# Patient Record
Sex: Female | Born: 1995 | Race: White | Hispanic: No | Marital: Single | Smoking: Never smoker
Health system: Southern US, Community
[De-identification: ages and names within clinical notes are randomized; demographics above are authoritative.]

## PROBLEM LIST (undated history)

## (undated) DIAGNOSIS — F4010 Social phobia, unspecified: Secondary | ICD-10-CM

## (undated) HISTORY — PX: OTHER SURGICAL HISTORY: SHX169

---
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
## 2014-07-13 LAB — CBC
HCT: 44.8 % (ref 35.0–47.0)
HGB: 14.7 g/dL (ref 12.0–16.0)
MCH: 32.1 pg (ref 26.0–34.0)
MCHC: 32.8 g/dL (ref 32.0–36.0)
MCV: 98 fL (ref 80–100)
Platelet: 213 10*3/uL (ref 150–440)
RBC: 4.58 10*6/uL (ref 3.80–5.20)
RDW: 12.6 % (ref 11.5–14.5)
WBC: 7.9 10*3/uL (ref 3.6–11.0)

## 2014-07-13 LAB — URINALYSIS, COMPLETE
Bilirubin,UR: NEGATIVE
Blood: NEGATIVE
GLUCOSE, UR: NEGATIVE mg/dL (ref 0–75)
LEUKOCYTE ESTERASE: NEGATIVE
NITRITE: NEGATIVE
Ph: 5 (ref 4.5–8.0)
Protein: NEGATIVE
RBC,UR: 1 /HPF (ref 0–5)
Specific Gravity: 1.01 (ref 1.003–1.030)
Squamous Epithelial: <1
WBC UR: <1 /HPF (ref 0–5)

## 2014-07-13 LAB — BASIC METABOLIC PANEL
Anion Gap: 13 (ref 7–16)
BUN: 8 mg/dL (ref 7–18)
CHLORIDE: 109 mmol/L — AB (ref 98–107)
CREATININE: 0.67 mg/dL (ref 0.60–1.30)
Calcium, Total: 8.5 mg/dL — ABNORMAL LOW (ref 9.0–10.7)
Co2: 18 mmol/L — ABNORMAL LOW (ref 21–32)
EGFR (African American): >60
EGFR (Non-African Amer.): >60
GLUCOSE: 60 mg/dL — AB (ref 65–99)
OSMOLALITY: 276 (ref 275–301)
Potassium: 3.9 mmol/L (ref 3.5–5.1)
Sodium: 140 mmol/L (ref 136–145)

## 2014-07-13 LAB — COMPREHENSIVE METABOLIC PANEL
ALK PHOS: 83 U/L
Albumin: 4.7 g/dL (ref 3.8–5.6)
Anion Gap: 17 — ABNORMAL HIGH (ref 7–16)
BUN: 9 mg/dL (ref 7–18)
Bilirubin,Total: 0.8 mg/dL (ref 0.2–1.0)
CREATININE: 0.54 mg/dL — AB (ref 0.60–1.30)
Calcium, Total: 9.2 mg/dL (ref 9.0–10.7)
Chloride: 105 mmol/L (ref 98–107)
Co2: 15 mmol/L — ABNORMAL LOW (ref 21–32)
EGFR (Non-African Amer.): >60
GLUCOSE: 71 mg/dL (ref 65–99)
Osmolality: 271 (ref 275–301)
Potassium: 5.1 mmol/L (ref 3.5–5.1)
SGOT(AST): 35 U/L — ABNORMAL HIGH (ref 0–26)
SGPT (ALT): 28 U/L
Sodium: 137 mmol/L (ref 136–145)
TOTAL PROTEIN: 8.4 g/dL (ref 6.4–8.6)

## 2014-07-13 LAB — DRUG SCREEN, URINE
Amphetamines, Ur Screen: NEGATIVE (ref ?–1000)
BARBITURATES, UR SCREEN: NEGATIVE (ref ?–200)
Benzodiazepine, Ur Scrn: NEGATIVE (ref ?–200)
COCAINE METABOLITE, UR ~~LOC~~: NEGATIVE (ref ?–300)
Cannabinoid 50 Ng, Ur ~~LOC~~: NEGATIVE (ref ?–50)
MDMA (ECSTASY) UR SCREEN: NEGATIVE (ref ?–500)
METHADONE, UR SCREEN: NEGATIVE (ref ?–300)
Opiate, Ur Screen: NEGATIVE (ref ?–300)
Phencyclidine (PCP) Ur S: NEGATIVE (ref ?–25)
Tricyclic, Ur Screen: NEGATIVE (ref ?–1000)

## 2014-07-13 LAB — ACETAMINOPHEN LEVEL: Acetaminophen: <2 ug/mL

## 2014-07-13 LAB — TSH: Thyroid Stimulating Horm: 0.97 u[IU]/mL

## 2014-07-13 LAB — ETHANOL: Ethanol: <3 mg/dL

## 2014-07-13 LAB — SALICYLATE LEVEL: Salicylates, Serum: <1.7 mg/dL

## 2014-07-14 ENCOUNTER — Inpatient Hospital Stay: Payer: Self-pay | Admitting: Psychiatry

## 2014-07-18 LAB — LIPID PANEL
Cholesterol: 121 mg/dL (ref 0–200)
HDL: 51 mg/dL (ref 40–60)
Ldl Cholesterol, Calc: 57 mg/dL (ref 0–100)
Triglycerides: 64 mg/dL (ref 0–135)
VLDL Cholesterol, Calc: 13 mg/dL (ref 5–40)

## 2014-07-18 LAB — HEMOGLOBIN A1C: HEMOGLOBIN A1C: 5.4 % (ref 4.2–6.3)

## 2014-07-23 LAB — HEPATIC FUNCTION PANEL A (ARMC)
ALBUMIN: 3.9 g/dL (ref 3.8–5.6)
ALK PHOS: 75 U/L
Bilirubin, Direct: <0.1 mg/dL (ref 0.0–0.2)
Bilirubin,Total: 0.3 mg/dL (ref 0.2–1.0)
SGOT(AST): 17 U/L (ref 0–26)
SGPT (ALT): 21 U/L
TOTAL PROTEIN: 7.1 g/dL (ref 6.4–8.6)

## 2014-07-23 LAB — CARBAMAZEPINE LEVEL, TOTAL
Carbamazepine: 13.6 ug/mL (ref 4.0–12.0)
Carbamazepine: 13.5 ug/mL (ref 4.0–12.0)

## 2014-07-27 LAB — CARBAMAZEPINE LEVEL, TOTAL: Carbamazepine: 3.1 ug/mL — ABNORMAL LOW (ref 4.0–12.0)

## 2014-12-04 NOTE — Consult Note (Signed)
   Psychological Assessment  [name] Pandarica Kimrey18of Evaluation: 07-22-14 through 12-12-15Administered: [facility] Multiphasic Personality Inventory-2 (MMPI-2) for Referral: Ms. [name] [name] was referred for a psychological assessment by her physician, Kristine LineaJolanta Pucilowska, MD.  She was admitted to Behavioral Medicine for treatment of disorganized behavior with depression and anxiety as well as insomnia. Please see the history and physical and psychosocial history for further background information. Information which would help with diagnostic clarification was requested. M MMPI-2 protocol is compared to that of other adult females she obtained the following profile: [id number]. The MMPI-2 validity scales indicate that the clinical profile is valid. They also suggest she is experiencing distress which she is attempting to minimize.  Clinical Presentation Moods: She reports that she is experiencing only a mild level of chronic. She is happy most of the time even though she reports a mild level of dysphoric mood and anhedonia. Her daily life is full of things that keep her interested. It makes her angry when people hurry. She reports that her attention, concentration seem alright but that she does have some problems with memory. She is apt to pass up something she wants to do when others think it is not worth doing. She believes that her judgment is better than it ever has been. She thinks that most people will use unfair means and stretch the truth to get ahead. She has often been misunderstood when she was trying to be helpful. She knows who is responsible for most of her troubles.  Relations: She reports that she is very introverted. She is very shy and is easily embarrassed. She is uncomfortable in social situations and likely not to speak to people until they speak to her. At parties she is more likely to sit by herself or with one other person than to join in with the crowd. Whenever possible she avoids being in  a crowd. She finds it hard to talk when she meets new people or is in a group of people. She does not seem to make friends as quickly as others do. She sometimes finds it hard to stand up for her rights because she is so reserved and she is easily downed in an argument. She is passive and prone to give up easily.  Problem Areas: She reports that she is in good physical health. However, she does not sleep very well. She usually has enough energy to do her work) and is about as able to work as she ever was. She is not likely to abuse substances. Her introversion and isolation help to keep her from getting into behavioral problems.  Her prognosis is guarded because of her minimal level of and chronic problems. Assertiveness or social-skill training may be beneficial if she can be engaged in the therapeutic process. There are a number of specific issues that must be kept in mind when establishing and maintaining the therapeutic alliance: she is so touchy on some subjects that she cannot talk about them, she is passive and nonassertive, it makes her nervous when people ask her personal questions, she hates going to doctors even when she is sick, and it is hard for her to accept compliments. Her difficulty in trusting and opening up to others makes the establishment of a therapeutic relationship a slow process. Impression:Anxiety DisorderPersistent Depressive Disorder   Electronic Signatures: Carola FrostRoush, Lee Ann (PsyD, HSP-P)  (Signed on 14-Dec-15 10:51)  Authored  Last Updated: 14-Dec-15 10:51 by Carola Frostoush, Lee Ann (PsyD, HSP-P)

## 2014-12-04 NOTE — H&P (Signed)
PATIENT NAME:  [name] [name], [name] [name] MR#:  [id number] DATE OF BIRTH:  July 17, 1996  REFERRING PHYSICIAN: Emergency Room MD   ATTENDING PHYSICIAN: Kairos Panetta B. Jennet Maduro, MD   IDENTIFYING DATA: Ms. [name] [name] is a 19 year old female with history of depression.   CHIEF COMPLAINT: "I don't feel well. "   HISTORY OF PRESENT ILLNESS: [name] has a history of depression for which she was prescribed antidepressants over the years by Dr. Elesa Massed. She has never been compliant and is unable to list the name of medications she has tried, but apparently there were several antidepressants involved. She initially sought psychiatric help for social anxiety that can be very severe. She reports that until end of June when she graduated from high school, she was doing alright. Over the summer, however, she started experiencing strange episodes of racing thoughts, hyperactivity, paranoia, and possibly hallucinations, although the patient does not want to discuss her voices. She also was receiving some messages. She says that up until now she would not have an episode that lasted a day or a couple of days at most. However, over the Thanksgiving holiday she has been increasingly paranoid, fearful, and disorganized. During initial interview in the Emergency Room, and today with me, she is unable to answer many simple questions. For example, I know that her psychiatrist is Dr. Elesa Massed from the chart, but when I asked her about it she was unable to name him and then was trying to ask me if this is the doctor who has his office in the back. She seems rather disorganized and may be slightly confused. Apparently, she came to the hospital after she called 911. She was trying to make  police aware that her friend was having suicidal thoughts. The police went to his house, supposedly he is alright, but somehow the patient herself ended up in the hospital as well. She disclosed psychotic symptoms to admitting psychiatrist as well. She denies mood symptoms.  She endorses severe social anxiety. She denies alcohol, illicit drugs, or prescription pill abuse.   PAST PSYCHIATRIC HISTORY:  Under the care of Dr. Elesa Massed for social anxiety and possibly depression. No suicide attempts, no substance abuse history, no hospitalizations.   FAMILY PSYCHIATRIC HISTORY: Mother with depression, possibly bipolar; sister with depression and diabetes; father with anger control issues.   PAST MEDICAL HISTORY: None.   ALLERGIES: No known drug allergies.   MEDICATIONS ON ADMISSION: None.   SOCIAL HISTORY:  Graduated from high school in June, does not have a job. Lives with her parents.  Her sister reports being bullied in school. She had a boyfriend in high school. Now reportedly, she has a new boyfriend. I am not sure if she drives.   REVIEW OF SYSTEMS:  CONSTITUTIONAL: No fevers or chills. No weight changes.  EYES: No double or blurred vision.  EARS, NOSE, THROAT: No hearing loss.  RESPIRATORY: No shortness of breath or cough.  CARDIOVASCULAR: No chest pain or orthopnea.  GASTROINTESTINAL: No abdominal pain, nausea, vomiting, or diarrhea.  GENITOURINARY: No incontinence or frequency.  ENDOCRINE: No heat or cold intolerance.  LYMPHATIC: No anemia or easy bruising.  INTEGUMENTARY: No acne or rash.  MUSCULOSKELETAL: No muscle or joint pain.  NEUROLOGIC: No tingling or weakness.  PSYCHIATRIC: See history of present illness for details.   PHYSICAL EXAMINATION: VITAL SIGNS: Blood pressure 119/83, pulse 105, respirations 18, temperature 98.5.  GENERAL: This is a slender young female, anxious-appearing.  HEENT: The pupils are equal, round, and reactive to light. Sclerae anicteric.  NECK:  Supple. No thyromegaly.  LUNGS: Clear to auscultation. No dullness to percussion.  HEART: Regular rhythm and rate. No murmurs, rubs, or gallops.  ABDOMEN: Soft, nontender, nondistended. Positive bowel sounds.  MUSCULOSKELETAL: Normal muscle strength in all extremities.  SKIN: No  rashes or bruises.  LYMPHATIC: No cervical adenopathy.  NEUROLOGIC: Cranial nerves II through XII are intact.   LABORATORY DATA: Chemistries are within normal limits. Blood alcohol level is zero. LFTs within normal limits except for AST of 35. TSH 0.97. Urine toxicology screen negative for substances. CBC within normal limits. Urinalysis is not suggestive of urinary tract infection. Serum acetaminophen and salicylates are low.   MENTAL STATUS EXAMINATION ON ADMISSION: The patient is alert and oriented to person, place, time, and situation. She is pleasant, polite, and cooperative. She maintains adequate eye contact. She is well groomed and casually dressed. Her speech is very soft. There is poverty of speech. Her mood is anxious with reactive affect. Thought process is logical and goal oriented with its own logic. She denies thoughts of hurting herself or others. She is paranoid and possibly delusional. She checks my name tag several times before we start talking. She is fearful frequently, but is okay in her room now. She is uncertain if she will be able to eat in the dining area. She denies hallucinations reluctantly. It is quite possible that she experiences some. Her cognition is grossly intact. Registration, recall, short- and long-term memory are intact. She is of average intelligence and fund of knowledge. Her insight and judgment seem fair.   SUICIDE RISK ASSESSMENT ON ADMISSION: This is a patient with history of depression and anxiety and new onset psychotic symptoms. She is at increased risk of suicide.   INITIAL DIAGNOSES:  AXIS I: Unspecified psychotic disorder, social anxiety disorder, major depressive disorder, moderate.  AXIS II: Deferred.  AXIS III: Deferred.   PLAN: The patient was admitted to [facility]al Medicine Unit for safety, stabilization, and medication management.  1.  Psychosis. We will start Abilify 5 mg, titrate as tolerated.  2.  Anxiety.  We will offer Vistaril for now.  3.  Insomnia. We will continue trazodone as initiated in the Emergency Room.   DISPOSITION: She will be discharged to home.   ____________________________ Ellin GoodieJolanta B. Jennet MaduroPucilowska, MD jbp:LT D: 07/14/2014 16:02:20 ET T: 07/14/2014 16:31:48 ET JOB#: [id number]  cc: Estrellita Lasky B. Jennet MaduroPucilowska, MD, <Dictator> Shari ProwsJOLANTA B Sneijder Bernards MD ELECTRONICALLY SIGNED 07/15/2014 17:27

## 2014-12-04 NOTE — Consult Note (Signed)
PATIENT NAME:  [name] [name] MR#:  [id number] DATE OF BIRTH:  10/13/1995  DATE OF CONSULTATION:  07/14/2014  IDENTIFYING INFORMATION AND REASON FOR CONSULT: A 19-year-old woman who was brought in under involuntary commitment yesterday to the Emergency Room filed by [facility]. Concerned about recent psychosis.   HISTORY OF PRESENT ILLNESS: Information obtained from the patient and the chart.   CHIEF COMPLAINT: "I had a nervous breakdown." She states that yesterday, her day started with her calling 911 on behalf of a friend of hers who she thought was suicidal. I do not have any outside information about that, it sounds like maybe somebody had reason to think that there was something strange about this. The patient reports that her mood recently has not been right. She has not been sleeping well for several days. Sometimes does not sleep at all. Mood has been overall up, but it has also been labile. She has been having racing thoughts and been feeling confused. She talks about getting messages and has a hard time describing it very clearly. When she came into the Emergency Room yesterday, she was extremely agitated and very psychotic. She was unable to give any useful history and was repeating herself over and over and calling people by incorrect names. She had gone to [facility] yesterday, evidently on her own initiative because she was not feeling right. While there, it looks like she probably displayed some psychosis and agitation, which caused them to bring her to our hospital. The patient does not report that she had actually tried to hurt herself. She denies that she has been drinking or using any recreational drugs. She has been prescribed an antidepressant, whose name she does not know by Dr. Moffett at [facility]. She has been intermittently compliant with it. She thinks that it makes her feel crazy and overstimulated.   PAST PSYCHIATRIC HISTORY: No known prior hospitalizations. No known history of suicide attempts. She  has been seeing providers at [facility] for perhaps as much as a year. She goes to, what sounds like at a group, about social anxiety and has seen Dr. Moffett who has prescribed a medicine that she says was an antidepressant. She says that she has been taking it for a couple of months and that it makes her feel like "the incredible hulk." She has not been back to see Dr. Moffett to tell him that it is causing side effects. She does not take it regularly, but has continued to take it at times. Does not report any other psychiatric medicine in the past.   SUBSTANCE ABUSE HISTORY: Denies that she uses alcohol or drugs. Denies any past substance abuse problems.   SOCIAL HISTORY: The patient lives with her parents and her sister. She graduated high school, but currently is not attending college and is not working right now. She talks about an ex-boyfriend, named Shawn. She talks about a person named Dakota on whose behalf she was calling 911 yesterday. She also talks about there being a person who is her current boyfriend and whom she is in communication with, but she has never actually met this person face to face. She says that she is planning to go to college, but has no clear plans out into the future. She is not able to tell me any history of any major trauma that would support PTSD.   SUBSTANCE ABUSE HISTORY: No evidence of current alcohol or drug abuse. Denies any past alcohol or drug abuse.   FAMILY HISTORY: Reports that both her   mother and sister have problems with depression.   CURRENT MEDICATIONS: Antidepressant of unknown type. She cannot remember the name.   ALLERGIES: No known drug allergies.  REVIEW OF SYSTEMS: The patient currently seems to be feeling a little bit better. She is not reporting acute hallucinations. Not reporting acute suicidal or homicidal ideation. Does not appear in any acute distress right now. Still feels nervous and worried.   MENTAL STATUS EXAMINATION: Adequately groomed  under the circumstances. Woman looks her stated age, cooperative with the interview and appears to be forthcoming. Eye contact good. Psychomotor activity normal. Speech is a little bit slow and quiet, but not to a really remarkable degree. Affect is somewhat constricted and odd at times. Mood is stated as being mostly happy. Her thoughts still are somewhat disorganized and rambling. She gets off topic and provides excessive detail at times. She denies that she is having hallucinations, but says that some things that may these sort of like that happen at times. Sometimes she feels like she is getting messages. Denies any current suicidal or homicidal ideation. Can recall 3/3 objects immediately, 3/3 at minute at 3 minutes. She is alert and oriented x4. Judgment and insight improved, certainly compared to yesterday. Normal fund of knowledge appears to be of normal intelligence.   LABORATORY RESULTS: Pregnancy test is negative. Chemistry panel showed glucose, low at 60, low CO2 and calcium, but nothing really remarkable. Drug screen is all negative. TSH is normal. The CBC is normal. She was slightly acidotic on blood gas that was done last night, but there is no follow up on that. Urinalysis has ketones, but is not infected.   VITAL SIGNS: In the Emergency Room, her blood pressure is currently 119/83, respirations 18, pulse 105, temperature 98.5.   ASSESSMENT: This is a 19 year old woman who presented extremely psychotic, hyperactive, agitated, confused yesterday. She gives a history of anxiety symptoms, but says that more recently, she has been having, what she calls nervous breakdowns. She has been prescribed an antidepressant and says that it makes her feel like the incredible hulk. For the last few days, she is not sleeping well, seems to be getting confused and agitated and yesterday was clearly psychotic. Differential diagnosis would include bipolar disorder, schizophreniform disorder or schizophrenia and  possibly substance induced psychosis. No evidence of drug abuse. No specific evidence of any medical problems. The patient needs hospitalization for stabilization and diagnostic clarity and treatment planning.   TREATMENT PLAN: Admit to psychiatry. Ativan p.r.n. has been provided. Appropriate precautions in place. Treatment team downstairs can decide on appropriate psychiatric medication otherwise. The patient has been educated about the process and rationale for treatment.   DIAGNOSIS, PRINCIPAL AND PRIMARY:  AXIS I: Psychosis not otherwise specified.   SECONDARY DIAGNOSES: AXIS I: Rule out bipolar disorder, manic, rule out schizophreniform disorder.  AXIS II: Deferred. AXIS III: No diagnosis.   ____________________________ Gonzella Lex, MD jtc:DT D: 07/14/2014 11:57:39 ET T: 07/14/2014 12:50:07 ET JOB#: [id number]  cc: Gonzella Lex, MD, <Dictator> Gonzella Lex MD ELECTRONICALLY SIGNED 07/25/2014 11:19

## 2017-07-02 ENCOUNTER — Emergency Department
Admission: EM | Admit: 2017-07-02 | Discharge: 2017-07-02 | Disposition: A | Discharge status: Home / Self Care | Payer: Medicaid Other | Attending: Emergency Medicine | Admitting: Emergency Medicine

## 2017-07-02 DIAGNOSIS — J029 Acute pharyngitis, unspecified: Secondary | ICD-10-CM | POA: Insufficient documentation

## 2017-07-02 HISTORY — DX: Social phobia, unspecified: F40.10

## 2017-07-02 LAB — POCT RAPID STREP A: STREPTOCOCCUS, GROUP A SCREEN (DIRECT): NEGATIVE

## 2017-07-02 MED ORDER — AMOXICILLIN-POT CLAVULANATE 875-125 MG PO TABS
1.0000 | ORAL_TABLET | Freq: Once | ORAL | Status: AC
  Administered 2017-07-02: 1 via ORAL
  Filled 2017-07-02: qty 1

## 2017-07-02 MED ORDER — AMOXICILLIN-POT CLAVULANATE 875-125 MG PO TABS: 1.0000 | ORAL_TABLET | Freq: Two times a day (BID) | ORAL | 0 refills | Status: AC

## 2017-07-02 MED ORDER — IBUPROFEN 800 MG PO TABS
800.0000 mg | ORAL_TABLET | Freq: Once | ORAL | Status: AC
  Administered 2017-07-02: 800 mg via ORAL
  Filled 2017-07-02: qty 1

## 2017-07-02 NOTE — ED Provider Notes (Signed)
[facility] Emergency Department Provider Note    First MD Initiated Contact with Patient 07/02/17 [phone]     (approximate)  I have reviewed the triage vital signs and the nursing notes.   HISTORY  Chief Complaint Sore Throat    HPI [name] [name] is a 21 y.o. female presents with 3-day history of sore throat with increasing severity which began yesterday.  Patient denies any fever afebrile on presentation with a temperature of 98.3.   Past Medical History:  Diagnosis Date  . Social anxiety disorder     There are no active problems to display for this patient.   Past surgical history None  Prior to Admission medications   Not on File    Allergies No known drug allergies No family history on file.  Social History Social History   Tobacco Use  . Smoking status: Never Smoker  . Smokeless tobacco: Never Used  Substance Use Topics  . Alcohol use: No    Frequency: Never  . Drug use: No    Review of Systems Constitutional: No fever/chills Eyes: No visual changes. ENT: Positive for sore throat. Cardiovascular: Denies chest pain. Respiratory: Denies shortness of breath. Gastrointestinal: No abdominal pain.  No nausea, no vomiting.  No diarrhea.  No constipation. Genitourinary: Negative for dysuria. Musculoskeletal: Negative for neck pain.  Negative for back pain. Integumentary: Negative for rash. Neurological: Negative for headaches, focal weakness or numbness.  ____________________________________________   PHYSICAL EXAM:  VITAL SIGNS: ED Triage Vitals  Enc Vitals Group     BP 07/02/17 0534 108/83     Pulse Rate 07/02/17 0534 (!) 123     Resp 07/02/17 0534 17     Temp 07/02/17 0534 98.3 F (36.8 C)     Temp Source 07/02/17 0534 Oral     SpO2 07/02/17 0534 96 %     Weight 07/02/17 0535 76.2 kg (168 lb)     Height --      Head Circumference --      Peak Flow --      Pain Score 07/02/17 0535 8     Pain Loc --      Pain  Edu? --      Excl. in GC? --     Constitutional: Alert and oriented. Well appearing and in no acute distress. Eyes: Conjunctivae are normal.  Head: Atraumatic. Mouth/Throat: Mucous membranes are moist.  Pharyngeal erythema with exudate Neck: No stridor.  Positive submandibular lymphadenopathy Cardiovascular: Normal rate, regular rhythm. Good peripheral circulation. Grossly normal heart sounds. Respiratory: Normal respiratory effort.  No retractions. Lungs CTAB. Gastrointestinal: Soft and nontender. No distention.  Musculoskeletal: No lower extremity tenderness nor edema. No gross deformities of extremities. Neurologic:  Normal speech and language. No gross focal neurologic deficits are appreciated.  Skin:  Skin is warm, dry and intact. No rash noted. Psychiatric: Mood and affect are normal. Speech and behavior are normal.  ____________________________________________   LABS (all labs ordered are listed, but only abnormal results are displayed)  Labs Reviewed  POCT RAPID STREP A     Procedures   ____________________________________________   INITIAL IMPRESSION / ASSESSMENT AND PLAN / ED COURSE  As part of my medical decision making, I reviewed the following data within the electronic MEDICAL RECORD 89 year old female present with above-stated history and physical exam consistent with acute pharyngitis.  Patient given Augmentin in the emergency department will be prescribed the same for home.  Spoke with the patient and mother at length regarding warning  signs and. ____________________________________________  FINAL CLINICAL IMPRESSION(S) / ED DIAGNOSES  Final diagnoses:  Acute pharyngitis, unspecified etiology     MEDICATIONS GIVEN DURING THIS VISIT:  Medications  amoxicillin-clavulanate (AUGMENTIN) 875-125 MG per tablet 1 tablet (1 tablet Oral Given 07/02/17 0612)  ibuprofen (ADVIL,MOTRIN) tablet 800 mg (800 mg Oral Given 07/02/17 [id number])     ED Discharge Orders      None       Note:  This document was prepared using Dragon voice recognition software and may include unintentional dictation errors.    Darci CurrentBrown,  N, MD 07/02/17 [phone]

## 2017-07-02 NOTE — ED Notes (Signed)
Pt states she has had a sore throat for 3-4 days. Pt states that her nephew has also been sick. Family at bedside.

## 2017-07-02 NOTE — ED Triage Notes (Signed)
Patient c/o sore throat X 3 days with increasing severity yesterday.

## 2018-02-13 ENCOUNTER — Emergency Department
Admission: EM | Admit: 2018-02-13 | Discharge: 2018-02-13 | Disposition: A | Discharge status: Home / Self Care | Payer: Medicaid Other | Attending: Emergency Medicine | Admitting: Emergency Medicine

## 2018-02-13 ENCOUNTER — Other Ambulatory Visit: Payer: Self-pay

## 2018-02-13 DIAGNOSIS — H66001 Acute suppurative otitis media without spontaneous rupture of ear drum, right ear: Secondary | ICD-10-CM

## 2018-02-13 DIAGNOSIS — H9201 Otalgia, right ear: Secondary | ICD-10-CM | POA: Diagnosis present

## 2018-02-13 MED ORDER — AMOXICILLIN 500 MG PO TABS: 500.0000 mg | ORAL_TABLET | Freq: Three times a day (TID) | ORAL | 0 refills | Status: DC

## 2018-02-13 MED ORDER — AMOXICILLIN 500 MG PO CAPS
500.0000 mg | ORAL_CAPSULE | Freq: Once | ORAL | Status: AC
  Administered 2018-02-13: 500 mg via ORAL
  Filled 2018-02-13: qty 1

## 2018-02-13 MED ORDER — IBUPROFEN 400 MG PO TABS
400.0000 mg | ORAL_TABLET | Freq: Once | ORAL | Status: AC
  Administered 2018-02-13: 400 mg via ORAL
  Filled 2018-02-13: qty 1

## 2018-02-13 NOTE — Discharge Instructions (Signed)
Rotate tylenol and ibuprofen if needed for pain or fever.

## 2018-02-13 NOTE — ED Triage Notes (Signed)
Pt c/o right ear pain x3-4 days - denies any other symptoms

## 2018-02-13 NOTE — ED Provider Notes (Signed)
[facility] Emergency Department Provider Note ____________________________________________  Time seen: Approximately 5:37 PM  I have reviewed the triage vital signs and the nursing notes.   HISTORY  Chief Complaint Otalgia    HPI [name] [name] is a 22 y.o. female who presents to the emergency department for treatment and evaluation of right earache.   Symptoms started 3 to 4 days ago.  No known fever.  She denies recent URI or other illness.  No relief with over-the-counter eardrops.  Past Medical History:  Diagnosis Date  . Social anxiety disorder     There are no active problems to display for this patient.   History reviewed. No pertinent surgical history.  Prior to Admission medications   Medication Sig Start Date End Date Taking? Authorizing Provider  amoxicillin (AMOXIL) 500 MG tablet Take 1 tablet (500 mg total) by mouth 3 (three) times daily. 02/13/18   Chinita Pesterriplett, Alyha Marines B, FNP    Allergies Patient has no known allergies.  No family history on file.  Social History Social History   Tobacco Use  . Smoking status: Never Smoker  . Smokeless tobacco: Never Used  Substance Use Topics  . Alcohol use: No    Frequency: Never  . Drug use: No    Review of Systems Constitutional: Negative for fever.  Negative for decreased ability to hear from right or left ear(s). Eyes: Negative for discharge or drainage. ENT:       Positive for otalgia in right ear(s).      Negative for rhinorrhea or congestion.      Negative for sore throat. Gastrointestinal: Negative for nausea, vomiting, or diarrhea. Musculoskeletal: Negative for myalgias. Skin: Negative for rash, lesions, or wounds. Neurological: Negative for paresthesias. ____________________________________________   PHYSICAL EXAM:  VITAL SIGNS: ED Triage Vitals [02/13/18 1657]  Enc Vitals Group     BP (!) 135/95     Pulse Rate 97     Resp 14     Temp 98 F (36.7 C)     Temp Source Oral      SpO2 99 %     Weight 178 lb (80.7 kg)     Height 5\' 4"  (1.626 m)     Head Circumference      Peak Flow      Pain Score 7     Pain Loc      Pain Edu?      Excl. in GC?     Constitutional: Well appearing. Eyes: Conjunctivae are clear without discharge or drainage. Ears:       Right TM: Erythematous, bulging.      Left TM: Normal. Head: Atraumatic. Nose: No rhinorrhea or sinus pain on percussion. Mouth/Throat: Oropharynx normal. Tonsils 1+ without exudate. Hematological/Lymphatic/Immunilogical: No palpable anterior cervical lymphadenopathy. Cardiovascular: Heart rate and rhythm are regular without murmur, gallop, or rub appreciated. Respiratory: Breath sounds are clear throughout to auscultation.  Neurologic:  Alert and oriented x 4. Skin: Intact and without rash, lesion, or wound on exposed skin surfaces. ____________________________________________   LABS (all labs ordered are listed, but only abnormal results are displayed)  Labs Reviewed - No data to display ____________________________________________   RADIOLOGY  Not indicated ____________________________________________   PROCEDURES  Procedure(s) performed:   Procedures  ____________________________________________   INITIAL IMPRESSION / ASSESSMENT AND PLAN / ED COURSE  22 year old female presenting to the emergency department for treatment and evaluation of right earache.  Symptoms and exam most consistent with otitis media.  She will be treated with amoxicillin  and advised to take ibuprofen and Tylenol and rotation if needed for pain.  She was encouraged to follow-up with her primary care provider for symptoms that are not improving over the next few days.  She was encouraged to return to the emergency department for symptoms of change or worsen if unable to schedule an appointment.  Pertinent labs & imaging results that were available during my care of the patient were reviewed by me and considered in  my medical decision making (see chart for details). ____________________________________________   FINAL CLINICAL IMPRESSION(S) / ED DIAGNOSES  Final diagnoses:  Acute suppurative otitis media of right ear without spontaneous rupture of tympanic membrane, recurrence not specified    ED Discharge Orders        Ordered    amoxicillin (AMOXIL) 500 MG tablet  3 times daily     02/13/18 1740      If controlled substance prescribed during this visit, 12 month history viewed on the [facility] prior to issuing an initial prescription for Schedule II or III opiod.   Note:  This document was prepared using Dragon voice recognition software and may include unintentional dictation errors.     Chinita Pester, FNP 02/13/18 1742    Sharman Cheek, MD 02/13/18 [phone]

## 2018-02-13 NOTE — ED Notes (Signed)
See triage note  Presents with right ear pain for about 3 days   No fever but states ear was draining

## 2018-06-02 ENCOUNTER — Encounter: Payer: Self-pay | Admitting: Nurse Practitioner

## 2018-06-11 ENCOUNTER — Encounter: Payer: Self-pay | Admitting: *Deleted

## 2018-08-01 ENCOUNTER — Ambulatory Visit: Payer: Medicaid Other | Admitting: Gastroenterology

## 2018-08-19 ENCOUNTER — Encounter: Payer: Self-pay | Admitting: Gastroenterology

## 2018-08-19 ENCOUNTER — Other Ambulatory Visit
Admission: RE | Admit: 2018-08-19 | Discharge: 2018-08-19 | Disposition: A | Discharge status: Home / Self Care | Payer: Medicaid Other | Attending: Gastroenterology | Admitting: Gastroenterology

## 2018-08-19 ENCOUNTER — Other Ambulatory Visit: Payer: Self-pay | Admitting: Gastroenterology

## 2018-08-19 ENCOUNTER — Ambulatory Visit: Payer: Medicaid Other | Admitting: Gastroenterology

## 2018-08-19 VITALS — BP 124/87 | HR 111 | Resp 18 | Wt 182.0 lb

## 2018-08-19 DIAGNOSIS — F4010 Social phobia, unspecified: Secondary | ICD-10-CM | POA: Diagnosis not present

## 2018-08-19 DIAGNOSIS — R197 Diarrhea, unspecified: Secondary | ICD-10-CM | POA: Insufficient documentation

## 2018-08-19 DIAGNOSIS — Z79899 Other long term (current) drug therapy: Secondary | ICD-10-CM | POA: Diagnosis not present

## 2018-08-19 DIAGNOSIS — R945 Abnormal results of liver function studies: Secondary | ICD-10-CM | POA: Diagnosis not present

## 2018-08-19 DIAGNOSIS — K580 Irritable bowel syndrome with diarrhea: Secondary | ICD-10-CM

## 2018-08-19 DIAGNOSIS — F418 Other specified anxiety disorders: Secondary | ICD-10-CM | POA: Diagnosis not present

## 2018-08-19 DIAGNOSIS — R7989 Other specified abnormal findings of blood chemistry: Secondary | ICD-10-CM

## 2018-08-19 DIAGNOSIS — R109 Unspecified abdominal pain: Secondary | ICD-10-CM | POA: Diagnosis not present

## 2018-08-19 LAB — HEPATIC FUNCTION PANEL
ALT: 66 U/L — ABNORMAL HIGH (ref 0–44)
AST: 97 U/L — ABNORMAL HIGH (ref 15–41)
Albumin: 4.2 g/dL (ref 3.5–5.0)
Alkaline Phosphatase: 79 U/L (ref 38–126)
Bilirubin, Direct: <0.1 mg/dL (ref 0.0–0.2)
Total Bilirubin: 0.4 mg/dL (ref 0.3–1.2)
Total Protein: 7.8 g/dL (ref 6.5–8.1)

## 2018-08-19 LAB — TSH: TSH: 5.866 u[IU]/mL — AB (ref 0.350–4.500)

## 2018-08-19 LAB — C-REACTIVE PROTEIN: CRP: 2.2 mg/dL — AB (ref <1.0)

## 2018-08-19 MED ORDER — DICYCLOMINE HCL 10 MG PO CAPS: 10.0000 mg | ORAL_CAPSULE | Freq: Three times a day (TID) | ORAL | 0 refills | Status: DC

## 2018-08-19 NOTE — Progress Notes (Signed)
[name] Repressohini R Tryone Kille, MD [address]  [address]  [place], [place] [address]  Main: [phone]  Fax: [phone]    Gastroenterology Consultation  Referring Provider:     Martie RoundSpencer, Nicole, NP Primary Care Physician:  Martie RoundSpencer, Nicole, NP Primary Gastroenterologist:  Dr. Arlyss Repressohini R Shavette Shoaff Reason for Consultation:     Chronic diarrhea and lower abdominal cramps        HPI:   [name] [name] is a 23 y.o. Caucasian female referred by Dr. Martie RoundSpencer, Nicole, NP  for consultation & management of 1 to 2 years history of nonbloody diarrhea associated with lower abdominal cramps and some abdominal bloating.  Patient has history of social anxiety, depression and currently under treatment.  Patient is accompanied by her mother today.  She has been experiencing 4-5 episodes of nonbloody diarrhea daily mostly during the day associated with severe lower abdominal cramps.  She tried over-the-counter antidiarrheal medications only.  She denies any nocturnal GI symptoms.  She denies weight loss.  She she does report mild bloating.  She denies nausea, vomiting.  She denies rectal bleeding, hematochezia.  Her labs from October 2019 revealed mildly elevated transaminases, CBC normal.  NSAIDs: None  Antiplts/Anticoagulants/Anti thrombotics: None  GI Procedures: None She denies family history of GI malignancy, celiac disease, inflammatory bowel disease  Past Medical History:  Diagnosis Date  . Social anxiety disorder     No past surgical history on file.  Current Outpatient Medications:  .  citalopram (CELEXA) 10 MG tablet, Take 10 mg by mouth daily., Disp: , Rfl:  .  hydrOXYzine (ATARAX/VISTARIL) 25 MG tablet, Take 25 mg by mouth daily., Disp: , Rfl:  .  norgestimate-ethinyl estradiol (ORTHO-CYCLEN,SPRINTEC,PREVIFEM) 0.25-35 MG-MCG tablet, Take 1 tablet by mouth daily., Disp: , Rfl:  .  OLANZapine (ZYPREXA) 20 MG tablet, Take 20 mg by mouth at bedtime., Disp: , Rfl:  .  dicyclomine (BENTYL) 10 MG  capsule, Take 1 capsule (10 mg total) by mouth 4 (four) times daily -  before meals and at bedtime., Disp: 120 capsule, Rfl: 0  No family history on file.   Social History   Tobacco Use  . Smoking status: Never Smoker  . Smokeless tobacco: Never Used  Substance Use Topics  . Alcohol use: No    Frequency: Never  . Drug use: No    Allergies as of 08/19/2018  . (No Known Allergies)    Review of Systems:    All systems reviewed and negative except where noted in HPI.   Physical Exam:  BP 124/87 (BP Location: Left Arm, Patient Position: Sitting, Cuff Size: Normal)   Pulse (!) 111   Resp 18   Wt 182 lb (82.6 kg)   BMI 31.24 kg/m  No LMP recorded.  General:   Alert,  Well-developed, well-nourished, pleasant and cooperative in NAD Head:  Normocephalic and atraumatic. Eyes:  Sclera clear, no icterus.   Conjunctiva pink. Ears:  Normal auditory acuity. Nose:  No deformity, discharge, or lesions. Mouth:  No deformity or lesions,oropharynx pink & moist. Neck:  Supple; no masses or thyromegaly. Lungs:  Respirations even and unlabored.  Clear throughout to auscultation.   No wheezes, crackles, or rhonchi. No acute distress. Heart:  Regular rate and rhythm; no murmurs, clicks, rubs, or gallops. Abdomen:  Normal bowel sounds. Soft, obese, increased abdominal girth, non-tender and non-distended without masses, hepatosplenomegaly or hernias noted.  No guarding or rebound tenderness.   Rectal: Not performed Msk:  Symmetrical without gross deformities. Good, equal movement &  strength bilaterally. Pulses:  Normal pulses noted. Extremities:  No clubbing or edema.  No cyanosis. Neurologic:  Alert and oriented x3;  grossly normal neurologically. Skin:  Intact without significant lesions or rashes. No jaundice. Lymph Nodes:  No significant cervical adenopathy. Psych:  Alert and cooperative. Normal mood and affect.  Imaging Studies: None  Assessment and Plan:   [name] [name] is a 23 y.o.  Caucasian female with social anxiety disorder, with 1 to 2 years history of lower abdominal cramps associated with nonbloody diarrhea without nocturnal symptoms and no other alarm features.  She also has mildly elevated transaminases.  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 pylori breath test, fecal calprotectin Start Bentyl 10 mg 3 times daily before meals and at bedtime Encouraged her to cut back on carbohydrates, carbonated beverages  Elevated transaminases: Could be secondary to carbamazepine or fatty liver Recheck LFTs today  Right upper quadrant ultrasound   Follow up in 4 weeks   [name] Repress, MD

## 2018-08-20 LAB — HIV ANTIBODY (ROUTINE TESTING W REFLEX): HIV Screen 4th Generation wRfx: NONREACTIVE

## 2018-08-20 LAB — TISSUE TRANSGLUTAMINASE, IGA

## 2018-08-20 LAB — H. PYLORI BREATH TEST: H. PYLORI UBIT: NEGATIVE

## 2018-08-21 LAB — BETA-2-GLYCOPROTEIN I ABS, IGG/M/A
Beta-2 Glyco I IgG: <9 GPI IgG units (ref 0–20)
Beta-2-Glycoprotein I IgA: <9 GPI IgA units (ref 0–25)

## 2018-08-21 LAB — CALPROTECTIN, FECAL: Calprotectin, Fecal: 43 ug/g (ref 0–120)

## 2018-08-25 ENCOUNTER — Ambulatory Visit
Admission: RE | Admit: 2018-08-25 | Discharge: 2018-08-25 | Disposition: A | Discharge status: Home / Self Care | Payer: Medicaid Other | Source: Ambulatory Visit | Attending: Gastroenterology | Admitting: Gastroenterology

## 2018-08-25 DIAGNOSIS — R945 Abnormal results of liver function studies: Secondary | ICD-10-CM | POA: Diagnosis present

## 2018-08-29 ENCOUNTER — Telehealth: Payer: Self-pay | Admitting: Gastroenterology

## 2018-08-29 NOTE — Telephone Encounter (Signed)
Please return [name] [name]'s call very anxious!

## 2018-08-29 NOTE — Telephone Encounter (Signed)
Pt and mom has been notified of lab results, pt's mom verbalized understanding and will follow up with PCP Nicole Spencer, lab results have been faxed to PCP 

## 2018-08-29 NOTE — Telephone Encounter (Signed)
Hadley Pen (mother)was returning Temeka's call for results.

## 2018-08-29 NOTE — Telephone Encounter (Signed)
Pt and mom has been notified of lab results, pt's mom verbalized understanding and will follow up with PCP Martie Round, lab results have been faxed to PCP

## 2018-09-22 ENCOUNTER — Encounter: Payer: Self-pay | Admitting: Gastroenterology

## 2018-09-22 ENCOUNTER — Ambulatory Visit: Payer: Medicaid Other | Admitting: Gastroenterology

## 2018-09-22 VITALS — BP 126/78 | HR 99 | Resp 17 | Wt 184.6 lb

## 2018-09-22 DIAGNOSIS — K580 Irritable bowel syndrome with diarrhea: Secondary | ICD-10-CM | POA: Diagnosis not present

## 2018-09-22 DIAGNOSIS — K760 Fatty (change of) liver, not elsewhere classified: Secondary | ICD-10-CM

## 2018-09-22 DIAGNOSIS — R7989 Other specified abnormal findings of blood chemistry: Secondary | ICD-10-CM

## 2018-09-22 DIAGNOSIS — R740 Nonspecific elevation of levels of transaminase and lactic acid dehydrogenase [LDH]: Secondary | ICD-10-CM

## 2018-09-22 DIAGNOSIS — R945 Abnormal results of liver function studies: Secondary | ICD-10-CM

## 2018-09-22 DIAGNOSIS — R7401 Elevation of levels of liver transaminase levels: Secondary | ICD-10-CM

## 2018-09-22 NOTE — Patient Instructions (Signed)
1. Check with your psychiatrist if you can switch from celexa or zyprexa to amitriptyline for IBS-diarrhea 2. Try imodium as needed for diarrhea 3. Try IB-Gaurd 4. Recheck liver tests in 5months  Please call our office to speak with my nurse Ulyses Jarred [phone] during business hours from 8am to 4pm if you have any questions/concerns. During after hours, you will be redirected to on call GI physician. For any emergency please call 911 or go the nearest emergency room.    Arlyss Repress, MD [address]  [address]  [place], [place] [address]  Main: [phone]  Fax: [phone]

## 2018-09-22 NOTE — Progress Notes (Signed)
Arlyss Repress, MD [address]  [address]  [place], [place] [address]  Main: [phone]  Fax: [phone]    Gastroenterology Consultation  Referring Provider:     Martie Round, NP Primary Care Physician:  Martie Round, NP Primary Gastroenterologist:  Dr. Arlyss Repress Reason for Consultation:     Chronic diarrhea and lower abdominal cramps        HPI:   [name] [name] is a 23 y.o. Caucasian female referred by Dr. Martie Round, NP  for consultation & management of 1 to 2 years history of nonbloody diarrhea associated with lower abdominal cramps and some abdominal bloating.  Patient has history of social anxiety, depression and currently under treatment.  Patient is accompanied by her mother today.  She has been experiencing 4-5 episodes of nonbloody diarrhea daily mostly during the day associated with severe lower abdominal cramps.  She tried over-the-counter antidiarrheal medications only.  She denies any nocturnal GI symptoms.  She denies weight loss.  She she does report mild bloating.  She denies nausea, vomiting.  She denies rectal bleeding, hematochezia.  Her labs from October 2019 revealed mildly elevated transaminases, CBC normal.  Follow-up visit 09/22/2018 Since last visit, patient reports that Bentyl 10 mg before meals and bedtime did not relieve her IBS symptoms.  Ultrasound revealed fatty liver, transaminases are mildly elevated.  Fecal calprotectin normal, TTG IgA normal, H. pylori breath test negative, HIV nonreactive, CRP is mildly elevated, TSH elevated.  Her PCP is aware of abnormal TSH and she has follow-up appointment with her on Wednesday  NSAIDs: None  Antiplts/Anticoagulants/Anti thrombotics: None  GI Procedures: None She denies family history of GI malignancy, celiac disease, inflammatory bowel disease  Past Medical History:  Diagnosis Date  . Social anxiety disorder     No past surgical history on file.  Current Outpatient Medications:    .  citalopram (CELEXA) 10 MG tablet, Take 10 mg by mouth daily., Disp: , Rfl:  .  hydrOXYzine (ATARAX/VISTARIL) 25 MG tablet, Take 25 mg by mouth daily., Disp: , Rfl:  .  norgestimate-ethinyl estradiol (ORTHO-CYCLEN,SPRINTEC,PREVIFEM) 0.25-35 MG-MCG tablet, Take 1 tablet by mouth daily., Disp: , Rfl:  .  OLANZapine (ZYPREXA) 20 MG tablet, Take 20 mg by mouth at bedtime., Disp: , Rfl:  .  dicyclomine (BENTYL) 10 MG capsule, Take 1 capsule (10 mg total) by mouth 4 (four) times daily -  before meals and at bedtime., Disp: 120 capsule, Rfl: 0  No family history on file.   Social History   Tobacco Use  . Smoking status: Never Smoker  . Smokeless tobacco: Never Used  Substance Use Topics  . Alcohol use: No    Frequency: Never  . Drug use: No    Allergies as of 09/22/2018  . (No Known Allergies)    Review of Systems:    All systems reviewed and negative except where noted in HPI.   Physical Exam:  BP 126/78 (BP Location: Left Arm, Patient Position: Sitting, Cuff Size: Normal)   Pulse 99   Resp 17   Wt 184 lb 9.6 oz (83.7 kg)   BMI 31.69 kg/m  No LMP recorded.  General:   Alert,  Well-developed, well-nourished, pleasant and cooperative in NAD Head:  Normocephalic and atraumatic. Eyes:  Sclera clear, no icterus.   Conjunctiva pink. Ears:  Normal auditory acuity. Nose:  No deformity, discharge, or lesions. Mouth:  No deformity or lesions,oropharynx pink & moist. Neck:  Supple; no masses or thyromegaly. Lungs:  Respirations even and unlabored.  Clear throughout to auscultation.   No wheezes, crackles, or rhonchi. No acute distress. Heart:  Regular rate and rhythm; no murmurs, clicks, rubs, or gallops. Abdomen:  Normal bowel sounds. Soft, obese, increased abdominal girth, non-tender and non-distended without masses, hepatosplenomegaly or hernias noted.  No guarding or rebound tenderness.   Rectal: Not performed Msk:  Symmetrical without gross deformities. Good, equal movement &  strength bilaterally. Pulses:  Normal pulses noted. Extremities:  No clubbing or edema.  No cyanosis. Neurologic:  Alert and oriented x3;  grossly normal neurologically. Skin:  Intact without significant lesions or rashes. No jaundice. Lymph Nodes:  No significant cervical adenopathy. Psych:  Alert and cooperative. Normal mood and affect.  Imaging Studies: None  Assessment and Plan:   [name] [name] is a 23 y.o. Caucasian female with social anxiety disorder, with 1 to 2 years history of lower abdominal cramps associated with nonbloody diarrhea without nocturnal symptoms and no other alarm features.  She also has mildly elevated transaminases.  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 pylori breath test, fecal calprotectin normal Bentyl did not help Imodium as needed for diarrhea Trial of IBgard Encouraged her to cut back on carbohydrates, carbonated beverages Advised her to discuss with her psychiatrist if she can try amitriptyline for IBS diarrhea given that she is on Zyprexa and Celexa  Elevated transaminases: Could be secondary to fatty liver Recheck LFTs in 3 months, if persistently elevated, will proceed with secondary liver disease work-up Right upper quadrant ultrasound revealed fatty liver Check hepatitis A, B and C serologies   Follow up in 3 months  Arlyss Repressohini R Vanga, MD

## 2018-09-23 LAB — HEPATITIS PANEL, ACUTE
HEP A IGM: NEGATIVE
Hep B C IgM: NEGATIVE
Hep C Virus Ab: <0.1 s/co ratio (ref 0.0–0.9)
Hepatitis B Surface Ag: NEGATIVE

## 2018-12-25 ENCOUNTER — Ambulatory Visit: Payer: Medicaid Other | Admitting: Gastroenterology

## 2018-12-25 ENCOUNTER — Encounter: Payer: Self-pay | Admitting: Gastroenterology

## 2019-07-12 ENCOUNTER — Other Ambulatory Visit: Payer: Self-pay

## 2019-07-12 ENCOUNTER — Emergency Department
Admission: EM | Admit: 2019-07-12 | Discharge: 2019-07-12 | Disposition: A | Discharge status: Home / Self Care | Payer: Medicaid Other | Attending: Emergency Medicine | Admitting: Emergency Medicine

## 2019-07-12 ENCOUNTER — Encounter: Payer: Self-pay | Admitting: Emergency Medicine

## 2019-07-12 DIAGNOSIS — R05 Cough: Secondary | ICD-10-CM | POA: Diagnosis present

## 2019-07-12 DIAGNOSIS — B349 Viral infection, unspecified: Secondary | ICD-10-CM

## 2019-07-12 DIAGNOSIS — Z79899 Other long term (current) drug therapy: Secondary | ICD-10-CM | POA: Diagnosis not present

## 2019-07-12 DIAGNOSIS — Z20822 Contact with and (suspected) exposure to covid-19: Secondary | ICD-10-CM

## 2019-07-12 DIAGNOSIS — U071 COVID-19: Secondary | ICD-10-CM | POA: Diagnosis not present

## 2019-07-12 DIAGNOSIS — Z20828 Contact with and (suspected) exposure to other viral communicable diseases: Secondary | ICD-10-CM

## 2019-07-12 NOTE — ED Triage Notes (Signed)
Pt presents to ED requesting COVID test. Reports mild cough, body aches, diarrhea x2-4 days. NAD.

## 2019-07-12 NOTE — ED Provider Notes (Signed)
[facility] EMERGENCY DEPARTMENT Provider Note   CSN: [id number] Arrival date & time: 07/12/19  4580     History   Chief Complaint Chief Complaint  Patient presents with  . COVID test    HPI [name] [name] is a 23 y.o. female.  Presents the emergency department for evaluation of viral illness.  Patient states she thinks she has Covid.  Her boyfriend was diagnosed with Covid 1 week ago and she has had symptoms for 3 days.  She has had dry cough, nasal congestion, mild diarrhea.  No chest pain or shortness of breath.  She has had some mild body aches and low-grade fevers.  Not taking anything for symptoms.  She is tolerating p.o. well.     HPI  Past Medical History:  Diagnosis Date  . Social anxiety disorder     Patient Active Problem List   Diagnosis Date Noted  . Fatty liver 09/22/2018  . Abnormal LFTs 09/22/2018  . Social anxiety disorder 08/19/2018    History reviewed. No pertinent surgical history.   OB History   No obstetric history on file.      Home Medications    Prior to Admission medications   Medication Sig Start Date End Date Taking? Authorizing Provider  citalopram (CELEXA) 10 MG tablet Take 10 mg by mouth daily.    [provider]  dicyclomine (BENTYL) 10 MG capsule Take 1 capsule (10 mg total) by mouth 4 (four) times daily -  before meals and at bedtime. 08/19/18 09/18/18  Lin Landsman, MD  hydrOXYzine (ATARAX/VISTARIL) 25 MG tablet Take 25 mg by mouth daily.    [provider]  norgestimate-ethinyl estradiol (ORTHO-CYCLEN,SPRINTEC,PREVIFEM) 0.25-35 MG-MCG tablet Take 1 tablet by mouth daily.    [provider]  OLANZapine (ZYPREXA) 20 MG tablet Take 20 mg by mouth at bedtime.    [provider]    Family History History reviewed. No pertinent family history.  Social History Social History   Tobacco Use  . Smoking status: Never Smoker  . Smokeless tobacco: Never Used  Substance Use  Topics  . Alcohol use: No    Frequency: Never  . Drug use: No     Allergies   Patient has no known allergies.   Review of Systems Review of Systems  Constitutional: Negative for fever (subjective).  HENT: Positive for congestion and rhinorrhea. Negative for ear discharge, sinus pressure, sinus pain, sore throat, trouble swallowing and voice change.   Respiratory: Positive for cough. Negative for shortness of breath, wheezing and stridor.   Cardiovascular: Negative for chest pain.  Gastrointestinal: Negative for abdominal pain, diarrhea, nausea and vomiting.  Genitourinary: Negative for dysuria, flank pain and pelvic pain.  Musculoskeletal: Positive for myalgias. Negative for back pain.  Skin: Negative for rash.  Neurological: Negative for dizziness and headaches.     Physical Exam Updated Vital Signs BP (!) 126/91 (BP Location: Left Arm)   Pulse 96   Temp 98.2 F (36.8 C) (Oral)   Resp 18   Ht 5\' 4"  (1.626 m)   Wt 79.4 kg   SpO2 98%   BMI 30.04 kg/m   Physical Exam Constitutional:      General: She is not in acute distress.    Appearance: She is well-developed.  HENT:     Head: Normocephalic and atraumatic.     Nose: No congestion or rhinorrhea.  Eyes:     Conjunctiva/sclera: Conjunctivae normal.  Neck:     Musculoskeletal: Normal range of  motion.  Cardiovascular:     Rate and Rhythm: Normal rate.  Pulmonary:     Effort: Pulmonary effort is normal. No respiratory distress.     Breath sounds: No stridor. No wheezing, rhonchi or rales.  Chest:     Chest wall: No tenderness.  Musculoskeletal: Normal range of motion.  Skin:    General: Skin is warm.     Findings: No rash.  Neurological:     Mental Status: She is alert and oriented to person, place, and time.  Psychiatric:        Behavior: Behavior normal.        Thought Content: Thought content normal.      ED Treatments / Results  Labs (all labs ordered are listed, but only abnormal results are  displayed) Labs Reviewed  NOVEL CORONAVIRUS, NAA (HOSP ORDER, SEND-OUT TO REF LAB; TAT 18-24 HRS)    EKG None  Radiology No results found.  Procedures Procedures (including critical care time)  Medications Ordered in ED Medications - No data to display   Initial Impression / Assessment and Plan / ED Course  I have reviewed the triage vital signs and the nursing notes.  Pertinent labs & imaging results that were available during my care of the patient were reviewed by me and considered in my medical decision making (see chart for details).        23 year old female with known exposure to Covid, has had symptoms for 3 days.  We will perform Covid test.  She is educated on signs and symptoms return to the ED for as well as proper quarantine until her test return as well as if her test is positive.  She understands signs symptoms return to ED for.  Final Clinical Impressions(s) / ED Diagnoses   Final diagnoses:  Viral illness  Exposure to COVID-19 virus    ED Discharge Orders    None       Ronnette Juniper 07/12/19 1005    Sharman Cheek, MD 07/12/19 1507

## 2019-07-12 NOTE — Discharge Instructions (Addendum)
Please take Tylenol and or ibuprofen as needed for symptoms.  Make sure you are drinking lots of fluids.  Please quarantine until your test have resulted.  If positive you will need quarantine for additional 7 days.  Return to the ER for any shortness of breath, worsening fevers, chest pain, worsening symptoms or urgent changes in health.

## 2019-07-12 NOTE — ED Notes (Signed)

## 2019-07-14 ENCOUNTER — Telehealth: Payer: Self-pay | Admitting: Emergency Medicine

## 2019-07-14 LAB — NOVEL CORONAVIRUS, NAA (HOSP ORDER, SEND-OUT TO REF LAB; TAT 18-24 HRS): SARS-CoV-2, NAA: DETECTED — AB

## 2019-07-14 NOTE — Telephone Encounter (Signed)
Called patient to inform of covid 19 result positive.  expained what "detected" means positive.  Explained isolationa nd quarantine guidelines.

## 2019-07-16 ENCOUNTER — Ambulatory Visit: Payer: Medicaid Other | Admitting: Gastroenterology

## 2019-09-08 ENCOUNTER — Encounter: Payer: Self-pay | Admitting: Gastroenterology

## 2019-09-08 ENCOUNTER — Ambulatory Visit: Payer: Medicaid Other | Admitting: Gastroenterology

## 2019-09-08 ENCOUNTER — Other Ambulatory Visit: Payer: Self-pay

## 2019-09-08 VITALS — BP 121/87 | HR 98 | Temp 98.6°F | Resp 17 | Wt 177.2 lb

## 2019-09-08 DIAGNOSIS — R7989 Other specified abnormal findings of blood chemistry: Secondary | ICD-10-CM

## 2019-09-08 DIAGNOSIS — K760 Fatty (change of) liver, not elsewhere classified: Secondary | ICD-10-CM | POA: Diagnosis not present

## 2019-09-08 DIAGNOSIS — K580 Irritable bowel syndrome with diarrhea: Secondary | ICD-10-CM

## 2019-09-08 DIAGNOSIS — K589 Irritable bowel syndrome without diarrhea: Secondary | ICD-10-CM | POA: Insufficient documentation

## 2019-09-08 MED ORDER — DICYCLOMINE HCL 10 MG PO CAPS: 20.0000 mg | ORAL_CAPSULE | Freq: Three times a day (TID) | ORAL | 2 refills | Status: AC

## 2019-09-08 NOTE — Progress Notes (Signed)
Cephas Darby, MD [address]  [place], [place] [address]  Main: [phone]  Fax: [phone]    Gastroenterology Consultation  Referring Provider:     Ranae Plumber, [place] Primary Care Physician:  Elisabeth Cara, NP Primary Gastroenterologist:  Dr. Cephas Darby Reason for Consultation:     Chronic diarrhea and lower abdominal cramps        HPI:   [name] [name] is a 24 y.o. Caucasian female referred by Dr. Elisabeth Cara, NP  for consultation & management of 1 to 2 years history of nonbloody diarrhea associated with lower abdominal cramps and some abdominal bloating.  Patient has history of social anxiety, depression and currently under treatment.  Patient is accompanied by her mother today.  She has been experiencing 4-5 episodes of nonbloody diarrhea daily mostly during the day associated with severe lower abdominal cramps.  She tried over-the-counter antidiarrheal medications only.  She denies any nocturnal GI symptoms.  She denies weight loss.  She she does report mild bloating.  She denies nausea, vomiting.  She denies rectal bleeding, hematochezia.  Her labs from October 2019 revealed mildly elevated transaminases, CBC normal.  Follow-up visit 09/22/2018 Since last visit, patient reports that Bentyl 10 mg before meals and bedtime did not relieve her IBS symptoms.  Ultrasound revealed fatty liver, transaminases are mildly elevated.  Fecal calprotectin normal, TTG IgA normal, H. pylori breath test negative, HIV nonreactive, CRP is mildly elevated, TSH elevated.  Her PCP is aware of abnormal TSH and she has follow-up appointment with her on Wednesday  Follow-up visit 09/08/2019 Patient was seen by me about 1 year ago for chronic diarrhea.  She is here today for follow-up of chronic diarrhea.  Patient is accompanied by her mom and she reports that her diarrhea has remained the same.  Predominantly postprandial, up to 3 times a day.  Occasionally nocturnal and  associated with fecal incontinence.  Patient reports that Bentyl did not help.  She did not discuss about amitriptyline with her psychiatrist.  She says her psychiatrist has passed away and she is currently seeing a new psychiatrist on a telemedicine.  From her previous work-up, she is found to have fatty liver.  Patient acknowledges that she continues to consume sweet tea every day, as well as high calorie foods.  She lost about 5 pounds within last 1 year.  Patient is on disability due to her underlying psychological illness with her mom.  She has sedentary lifestyle other than cleaning homes.  She does not smoke.  NSAIDs: None  Antiplts/Anticoagulants/Anti thrombotics: None  GI Procedures: None She denies family history of GI malignancy, celiac disease, inflammatory bowel disease  Past Medical History:  Diagnosis Date  . Social anxiety disorder     No past surgical history on file.  Current Outpatient Medications:  .  citalopram (CELEXA) 10 MG tablet, Take 10 mg by mouth daily., Disp: , Rfl:  .  dicyclomine (BENTYL) 10 MG capsule, Take 2 capsules (20 mg total) by mouth 4 (four) times daily -  before meals and at bedtime., Disp: 240 capsule, Rfl: 2 .  hydrOXYzine (ATARAX/VISTARIL) 25 MG tablet, Take 25 mg by mouth daily., Disp: , Rfl:  .  norgestimate-ethinyl estradiol (ORTHO-CYCLEN,SPRINTEC,PREVIFEM) 0.25-35 MG-MCG tablet, Take 1 tablet by mouth daily., Disp: , Rfl:  .  OLANZapine (ZYPREXA) 20 MG tablet, Take 20 mg by mouth at bedtime., Disp: , Rfl:   No family history on file.   Social History   Tobacco  Use  . Smoking status: Never Smoker  . Smokeless tobacco: Never Used  Substance Use Topics  . Alcohol use: No  . Drug use: No    Allergies as of 09/08/2019  . (No Known Allergies)    Review of Systems:    All systems reviewed and negative except where noted in HPI.   Physical Exam:  BP 121/87 (BP Location: Left Arm, Patient Position: Sitting, Cuff Size: Large)   Pulse  98   Temp 98.6 F (37 C)   Resp 17   Wt 177 lb 3.2 oz (80.4 kg)   BMI 30.42 kg/m  No LMP recorded.  General:   Alert,  Well-developed, well-nourished, pleasant and cooperative in NAD Head:  Normocephalic and atraumatic. Eyes:  Sclera clear, no icterus.   Conjunctiva pink. Ears:  Normal auditory acuity. Nose:  No deformity, discharge, or lesions. Mouth:  No deformity or lesions,oropharynx pink & moist. Neck:  Supple; no masses or thyromegaly. Lungs:  Respirations even and unlabored.  Clear throughout to auscultation.   No wheezes, crackles, or rhonchi. No acute distress. Heart:  Regular rate and rhythm; no murmurs, clicks, rubs, or gallops. Abdomen:  Normal bowel sounds. Soft, obese, increased abdominal girth, non-tender and non-distended without masses, hepatosplenomegaly or hernias noted.  No guarding or rebound tenderness.   Rectal: Not performed Msk:  Symmetrical without gross deformities. Good, equal movement & strength bilaterally. Pulses:  Normal pulses noted. Extremities:  No clubbing or edema.  No cyanosis. Neurologic:  Alert and oriented x3;  grossly normal neurologically. Skin:  Intact without significant lesions or rashes. No jaundice. Psych:  Alert and cooperative. Normal mood and affect.  Imaging Studies: Reviewed  Assessment and Plan:   [name] is a 24 y.o. Caucasian female with social anxiety disorder, with 1 to 2 years history of lower abdominal cramps associated with nonbloody diarrhea without nocturnal symptoms and no other alarm features.  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  TSH was elevated in the past, TTG IgA, HIV, H. pylori breath test, fecal calprotectin normal Recommend stool studies to evaluate infection Recheck CBC, TSH, free T4 Try high-dose Bentyl 20 mg before each meal and at bedtime Imodium as needed for diarrhea Encouraged  her to completely eliminate sweet tea, carbonated beverages and cut back on carbohydrates Advised her to discuss with her psychiatrist if she can try amitriptyline for IBS diarrhea given that she is already on Zyprexa and Celexa  Fatty liver disease based on right upper quadrant ultrasound Acute viral hepatitis panel negative Recheck LFTs today, if persistently elevated, will proceed with secondary liver disease work-up Patient very young to have developed fatty liver and this is concerning.  Reinforced to the patient and her mother about long-term sequelae of fatty liver, leading to chronic liver disease, cirrhosis I have strongly advised her to adapt healthy eating habits, provided her with information on weight loss, incorporate exercise regularly   Follow up in 2-3 months  Arlyss Repress, MD

## 2019-09-08 NOTE — Patient Instructions (Signed)
Calorie Counting for Weight Loss °Calories are units of energy. Your body needs a certain amount of calories from food to keep you going throughout the day. When you eat more calories than your body needs, your body stores the extra calories as fat. When you eat fewer calories than your body needs, your body burns fat to get the energy it needs. °Calorie counting means keeping track of how many calories you eat and drink each day. Calorie counting can be helpful if you need to lose weight. If you make sure to eat fewer calories than your body needs, you should lose weight. Ask your health care provider what a healthy weight is for you. °For calorie counting to work, you will need to eat the right number of calories in a day in order to lose a healthy amount of weight per week. A dietitian can help you determine how many calories you need in a day and will give you suggestions on how to reach your calorie goal. °· A healthy amount of weight to lose per week is usually 1-2 lb (0.5-0.9 kg). This usually means that your daily calorie intake should be reduced by 500-750 calories. °· Eating 1,200 - 1,500 calories per day can help most women lose weight. °· Eating 1,500 - 1,800 calories per day can help most men lose weight. °What is my plan? °My goal is to have _____1500_____ calories per day. °If I have this many calories per day, I should lose around _____1-2_____ pounds per week. °What do I need to know about calorie counting? °In order to meet your daily calorie goal, you will need to: °· Find out how many calories are in each food you would like to eat. Try to do this before you eat. °· Decide how much of the food you plan to eat. °· Write down what you ate and how many calories it had. Doing this is called keeping a food log. °To successfully lose weight, it is important to balance calorie counting with a healthy lifestyle that includes regular activity. Aim for 150 minutes of moderate exercise (such as walking) or  75 minutes of vigorous exercise (such as running) each week. °Where do I find calorie information? ° °The number of calories in a food can be found on a Nutrition Facts label. If a food does not have a Nutrition Facts label, try to look up the calories online or ask your dietitian for help. °Remember that calories are listed per serving. If you choose to have more than one serving of a food, you will have to multiply the calories per serving by the amount of servings you plan to eat. For example, the label on a package of bread might say that a serving size is 1 slice and that there are 90 calories in a serving. If you eat 1 slice, you will have eaten 90 calories. If you eat 2 slices, you will have eaten 180 calories. °How do I keep a food log? °Immediately after each meal, record the following information in your food log: °· What you ate. Don't forget to include toppings, sauces, and other extras on the food. °· How much you ate. This can be measured in cups, ounces, or number of items. °· How many calories each food and drink had. °· The total number of calories in the meal. °Keep your food log near you, such as in a small notebook in your pocket, or use a mobile app or website. Some programs will calculate   calories for you and show you how many calories you have left for the day to meet your goal. °What are some calorie counting tips? ° °· Use your calories on foods and drinks that will fill you up and not leave you hungry: °? Some examples of foods that fill you up are nuts and nut butters, vegetables, lean proteins, and high-fiber foods like whole grains. High-fiber foods are foods with more than 5 g fiber per serving. °? Drinks such as sodas, specialty coffee drinks, alcohol, and juices have a lot of calories, yet do not fill you up. °· Eat nutritious foods and avoid empty calories. Empty calories are calories you get from foods or beverages that do not have many vitamins or protein, such as candy, sweets,  and soda. It is better to have a nutritious high-calorie food (such as an avocado) than a food with few nutrients (such as a bag of chips). °· Know how many calories are in the foods you eat most often. This will help you calculate calorie counts faster. °· Pay attention to calories in drinks. Low-calorie drinks include water and unsweetened drinks. °· Pay attention to nutrition labels for "low fat" or "fat free" foods. These foods sometimes have the same amount of calories or more calories than the full fat versions. They also often have added sugar, starch, or salt, to make up for flavor that was removed with the fat. °· Find a way of tracking calories that works for you. Get creative. Try different apps or programs if writing down calories does not work for you. °What are some portion control tips? °· Know how many calories are in a serving. This will help you know how many servings of a certain food you can have. °· Use a measuring cup to measure serving sizes. You could also try weighing out portions on a kitchen scale. With time, you will be able to estimate serving sizes for some foods. °· Take some time to put servings of different foods on your favorite plates, bowls, and cups so you know what a serving looks like. °· Try not to eat straight from a bag or box. Doing this can lead to overeating. Put the amount you would like to eat in a cup or on a plate to make sure you are eating the right portion. °· Use smaller plates, glasses, and bowls to prevent overeating. °· Try not to multitask (for example, watch TV or use your computer) while eating. If it is time to eat, sit down at a table and enjoy your food. This will help you to know when you are full. It will also help you to be aware of what you are eating and how much you are eating. °What are tips for following this plan? °Reading food labels °· Check the calorie count compared to the serving size. The serving size may be smaller than what you are used  to eating. °· Check the source of the calories. Make sure the food you are eating is high in vitamins and protein and low in saturated and trans fats. °Shopping °· Read nutrition labels while you shop. This will help you make healthy decisions before you decide to purchase your food. °· Make a grocery list and stick to it. °Cooking °· Try to cook your favorite foods in a healthier way. For example, try baking instead of frying. °· Use low-fat dairy products. °Meal planning °· Use more fruits and vegetables. Half of your plate should be fruits   and vegetables. °· Include lean proteins like poultry and fish. °How do I count calories when eating out? °· Ask for smaller portion sizes. °· Consider sharing an entree and sides instead of getting your own entree. °· If you get your own entree, eat only half. Ask for a box at the beginning of your meal and put the rest of your entree in it so you are not tempted to eat it. °· If calories are listed on the menu, choose the lower calorie options. °· Choose dishes that include vegetables, fruits, whole grains, low-fat dairy products, and lean protein. °· Choose items that are boiled, broiled, grilled, or steamed. Stay away from items that are buttered, battered, fried, or served with cream sauce. Items labeled "crispy" are usually fried, unless stated otherwise. °· Choose water, low-fat milk, unsweetened iced tea, or other drinks without added sugar. If you want an alcoholic beverage, choose a lower calorie option such as a glass of wine or light beer. °· Ask for dressings, sauces, and syrups on the side. These are usually high in calories, so you should limit the amount you eat. °· If you want a salad, choose a garden salad and ask for grilled meats. Avoid extra toppings like bacon, cheese, or fried items. Ask for the dressing on the side, or ask for olive oil and vinegar or lemon to use as dressing. °· Estimate how many servings of a food you are given. For example, a serving  of cooked rice is ½ cup or about the size of half a baseball. Knowing serving sizes will help you be aware of how much food you are eating at restaurants. The list below tells you how big or small some common portion sizes are based on everyday objects: °? 1 oz--4 stacked dice. °? 3 oz--1 deck of cards. °? 1 tsp--1 die. °? 1 Tbsp--½ a ping-pong ball. °? 2 Tbsp--1 ping-pong ball. °? ½ cup--½ baseball. °? 1 cup--1 baseball. °Summary °· Calorie counting means keeping track of how many calories you eat and drink each day. If you eat fewer calories than your body needs, you should lose weight. °· A healthy amount of weight to lose per week is usually 1-2 lb (0.5-0.9 kg). This usually means reducing your daily calorie intake by 500-750 calories. °· The number of calories in a food can be found on a Nutrition Facts label. If a food does not have a Nutrition Facts label, try to look up the calories online or ask your dietitian for help. °· Use your calories on foods and drinks that will fill you up, and not on foods and drinks that will leave you hungry. °· Use smaller plates, glasses, and bowls to prevent overeating. °This information is not intended to replace advice given to you by your health care provider. Make sure you discuss any questions you have with your health care provider. °Document Revised: 04/18/2018 Document Reviewed: 06/29/2016 °Elsevier Patient Education © 2020 [employer] ° °

## 2019-09-09 LAB — COMPREHENSIVE METABOLIC PANEL
ALT: 26 IU/L (ref 0–32)
AST: 29 IU/L (ref 0–40)
Albumin/Globulin Ratio: 1.6 (ref 1.2–2.2)
Albumin: 4.1 g/dL (ref 3.9–5.0)
Alkaline Phosphatase: 80 IU/L (ref 39–117)
BUN/Creatinine Ratio: 18 (ref 9–23)
BUN: 9 mg/dL (ref 6–20)
Bilirubin Total: <0.2 mg/dL (ref 0.0–1.2)
CO2: 24 mmol/L (ref 20–29)
Calcium: 9.6 mg/dL (ref 8.7–10.2)
Chloride: 102 mmol/L (ref 96–106)
Creatinine, Ser: 0.51 mg/dL — ABNORMAL LOW (ref 0.57–1.00)
GFR calc Af Amer: 156 mL/min/{1.73_m2} (ref >59)
GFR calc non Af Amer: 135 mL/min/{1.73_m2} (ref >59)
Globulin, Total: 2.6 g/dL (ref 1.5–4.5)
Glucose: 133 mg/dL — ABNORMAL HIGH (ref 65–99)
Potassium: 4 mmol/L (ref 3.5–5.2)
Sodium: 139 mmol/L (ref 134–144)
Total Protein: 6.7 g/dL (ref 6.0–8.5)

## 2019-09-09 LAB — TSH+FREE T4
Free T4: 0.97 ng/dL (ref 0.82–1.77)
TSH: 8.13 u[IU]/mL — ABNORMAL HIGH (ref 0.450–4.500)

## 2019-09-09 LAB — CBC
Hematocrit: 42 % (ref 34.0–46.6)
Hemoglobin: 13.4 g/dL (ref 11.1–15.9)
MCH: 28.1 pg (ref 26.6–33.0)
MCHC: 31.9 g/dL (ref 31.5–35.7)
MCV: 88 fL (ref 79–97)
Platelets: 283 10*3/uL (ref 150–450)
RBC: 4.77 x10E6/uL (ref 3.77–5.28)
RDW: 13.5 % (ref 11.7–15.4)
WBC: 7.7 10*3/uL (ref 3.4–10.8)

## 2019-11-09 ENCOUNTER — Encounter: Payer: Self-pay | Admitting: Gastroenterology

## 2019-11-09 ENCOUNTER — Ambulatory Visit (INDEPENDENT_AMBULATORY_CARE_PROVIDER_SITE_OTHER): Payer: Medicaid Other | Admitting: Gastroenterology

## 2019-11-09 DIAGNOSIS — K580 Irritable bowel syndrome with diarrhea: Secondary | ICD-10-CM | POA: Diagnosis not present

## 2019-11-09 DIAGNOSIS — K760 Fatty (change of) liver, not elsewhere classified: Secondary | ICD-10-CM | POA: Diagnosis not present

## 2019-11-09 NOTE — Progress Notes (Signed)
Lannette Donath, MD [address]  [address]  [place], [place] [address]  Main: [phone]  Fax: [phone]    Gastroenterology Consultation Tele Visit  Referring Provider:     Martie Round, NP Primary Care Physician:  Martie Round, NP Primary Gastroenterologist:  Dr. Arlyss Repress Reason for Consultation:     IBS diarrhea        HPI:   [name] [name] is a 24 y.o. female referred by Dr. Martie Round, NP  for consultation & management of IBS diarrhea  Virtual Visit via Telephone Note  I connected with Rhylin Venters on 11/09/19 at  3:00 PM EDT by telephone and verified that I am speaking with the correct person using two identifiers.   I discussed the limitations, risks, security and privacy concerns of performing an evaluation and management service by telephone and the availability of in person appointments. I also discussed with the patient that there may be a patient responsible charge related to this service. The patient expressed understanding and agreed to proceed.  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  She had extensive work-up which was negative for celiac disease, Helicobacter pylori infection.  Fecal calprotectin is normal.  TSH is elevated, free T4 normal.  No evidence of anemia.  HIV negative.  LFTs normal.  She reports that her IBS symptoms are modestly improved with modification in her diet, she has significantly limited her intake of Carbonated beverages, fatty foods from her diet.  She notices worsening of IBS symptoms with greasy foods.  She does not think high-dose Bentyl is helping.  She takes Pepto-Bismol as needed for diarrhea.  Her weight has been stable.      NSAIDs: None  Antiplts/Anticoagulants/Anti thrombotics:  None  GI Procedures: None She denies family history of GI malignancy, celiac disease, inflammatory bowel disease   Past Medical History:  Diagnosis Date  . Social anxiety disorder     Past Surgical History:  Procedure Laterality Date  . no surgical history      Current Outpatient Medications:  .  citalopram (CELEXA) 10 MG tablet, Take 10 mg by mouth daily., Disp: , Rfl:  .  hydrOXYzine (ATARAX/VISTARIL) 25 MG tablet, Take 25 mg by mouth daily., Disp: , Rfl:  .  norgestimate-ethinyl estradiol (ORTHO-CYCLEN,SPRINTEC,PREVIFEM) 0.25-35 MG-MCG tablet, Take 1 tablet by mouth daily., Disp: , Rfl:  .  OLANZapine (ZYPREXA) 20 MG tablet, Take 20 mg by mouth at bedtime., Disp: , Rfl:  .  dicyclomine (BENTYL) 10 MG capsule, Take 2 capsules (20 mg total) by mouth 4 (four) times daily -  before meals and at bedtime., Disp: 240 capsule, Rfl: 2   History reviewed. No pertinent family history.   Social History   Tobacco Use  . Smoking status: Never Smoker  . Smokeless tobacco: Never Used  Substance Use Topics  . Alcohol use: No  . Drug use: No    Allergies as of 11/09/2019  . (No Known Allergies)    Imaging Studies: Reviewed  Assessment and Plan:   Normalee Sistare is a 24 y.o. female with social anxiety disorder, with 1 to 2 years history of lower abdominal cramps associated with nonbloody diarrhea without nocturnal symptoms and no other alarm features.  She also has fatty liver disease  IBS-diarrhea: Celiac disease negative, H. pylori  breath test negative, normal fecal calprotectin levels TSH elevated, free T4 normal Reiterated on healthy eating habits Continue Bentyl and Pepto-Bismol as needed  Reminded patient about the GI profile PCR to rule out infectious etiology  Fatty liver Last LFTs were normal Reiterated on healthy lifestyle Recheck LFTs every 6 months   Follow Up Instructions:   I discussed the assessment and treatment plan with the patient. The patient was  provided an opportunity to ask questions and all were answered. The patient agreed with the plan and demonstrated an understanding of the instructions.   The patient was advised to call back or seek an in-person evaluation if the symptoms worsen or if the condition fails to improve as anticipated.  I provided 12 minutes of non-face-to-face time during this encounter.   Follow up in 34months   Cephas Darby, MD

## 2021-01-08 IMAGING — US US ABDOMEN LIMITED
1 series · 14 of 25 positions shown · non-contrast
Comparison: None.

CLINICAL DATA: Elevated LFTs

EXAM:
ULTRASOUND ABDOMEN LIMITED RIGHT UPPER QUADRANT

[Series 1: us abdomen limited · 0.20mm/px · 14 of 64 slices shown]
[im 1/64]
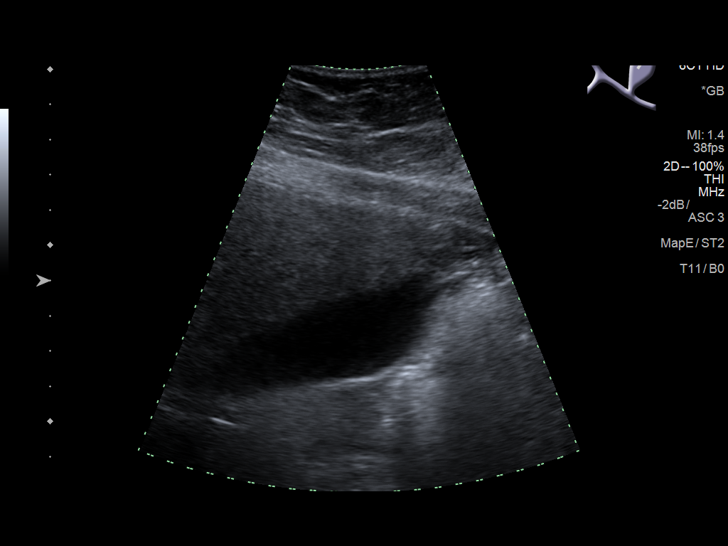
[im 6/64]
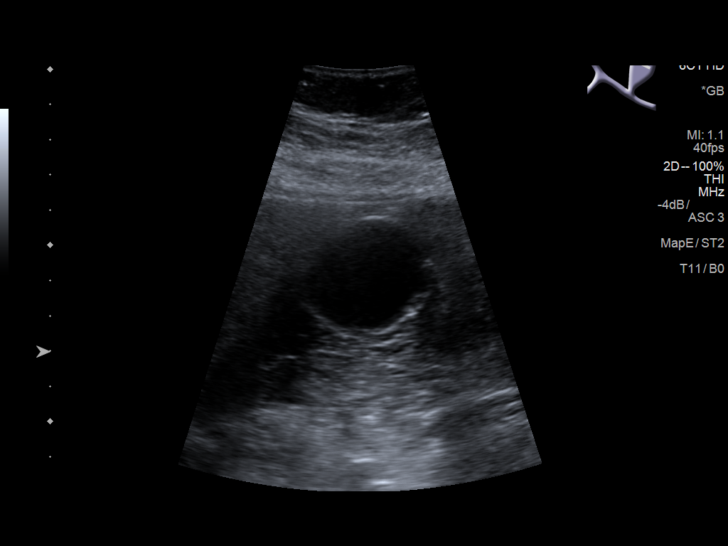
[im 11/64]
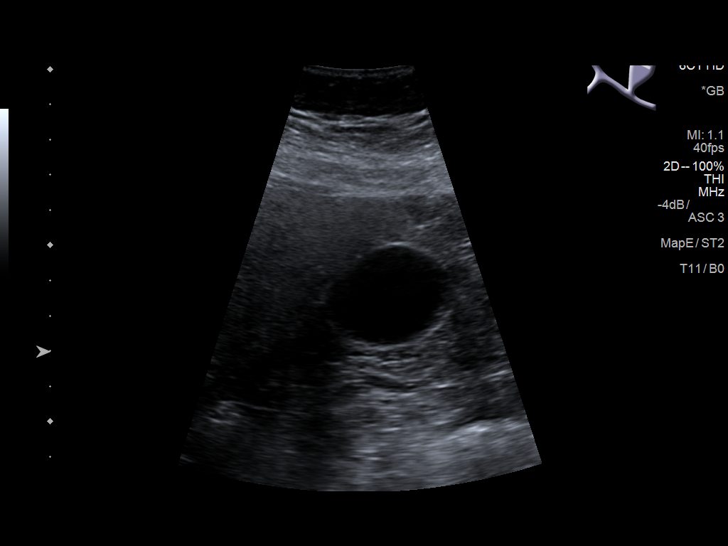
[im 16/64]
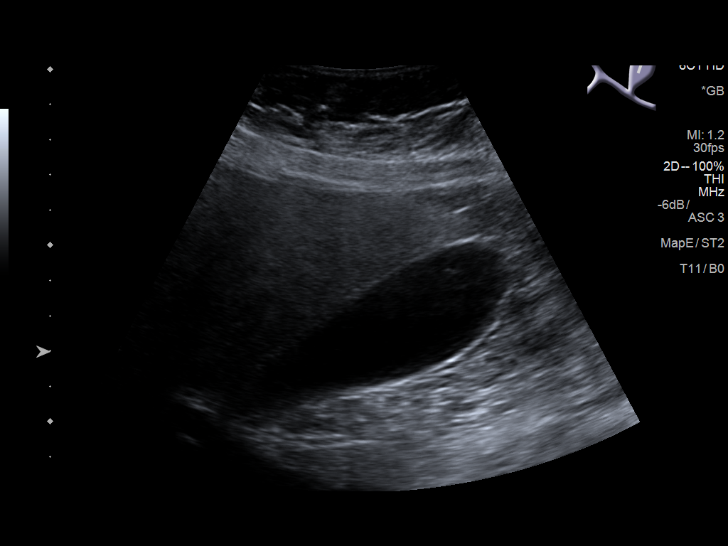
[im 22/64]
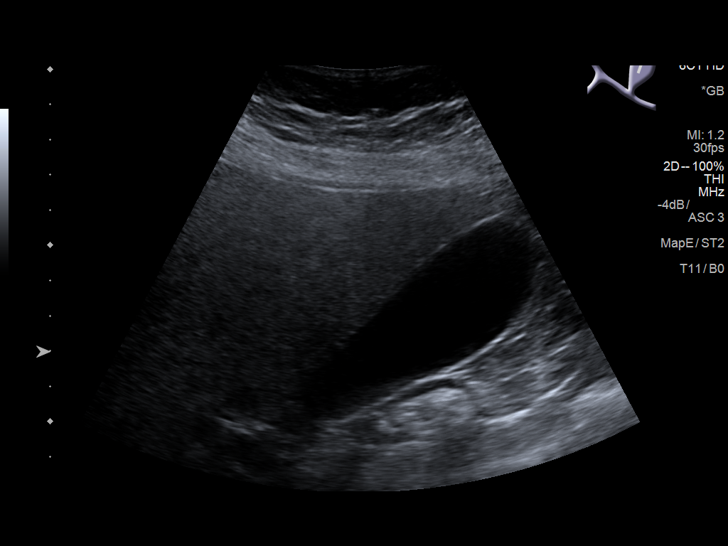
[im 24/64]
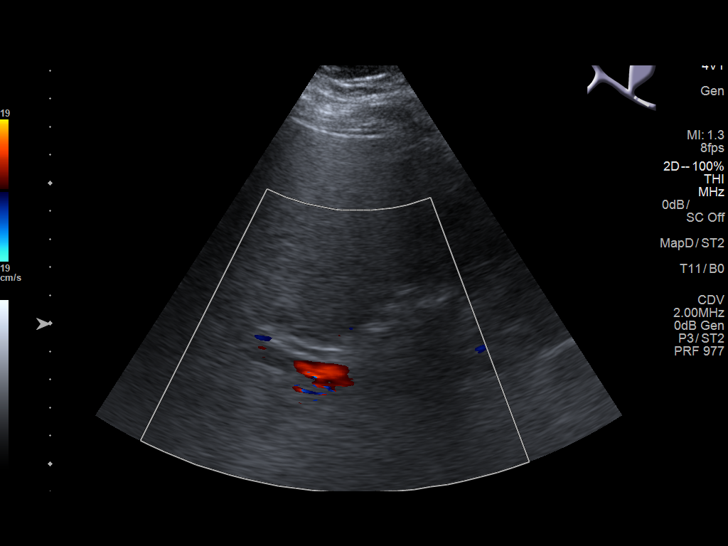
[im 29/64]
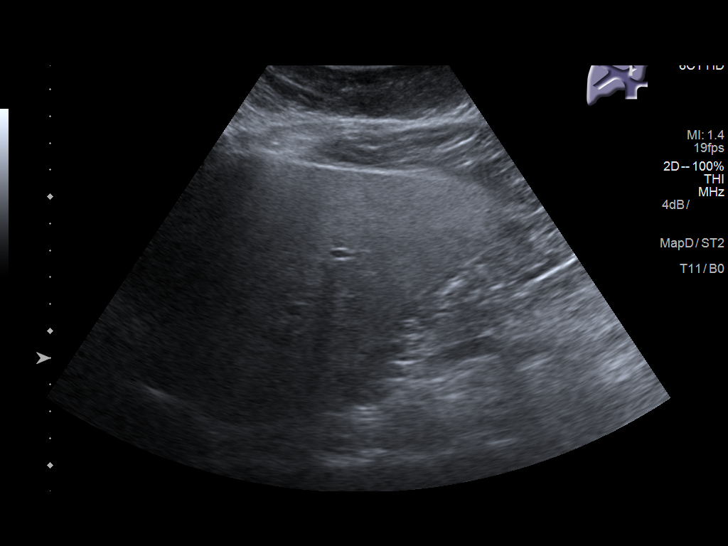
[im 35/64]
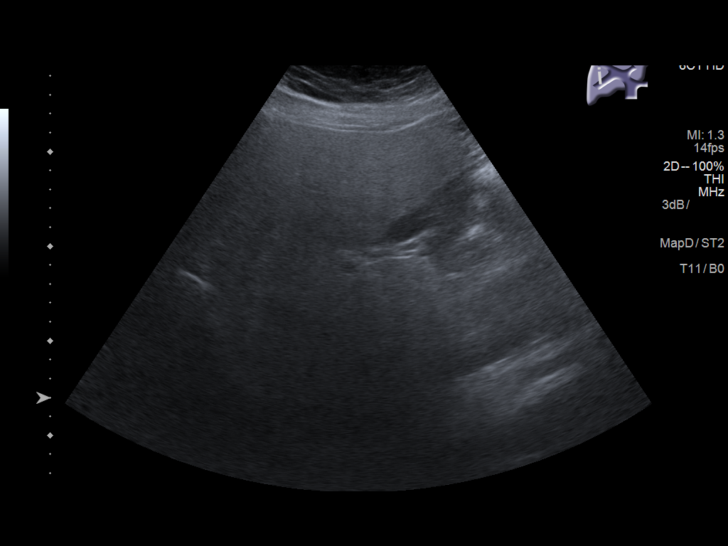
[im 40/64]
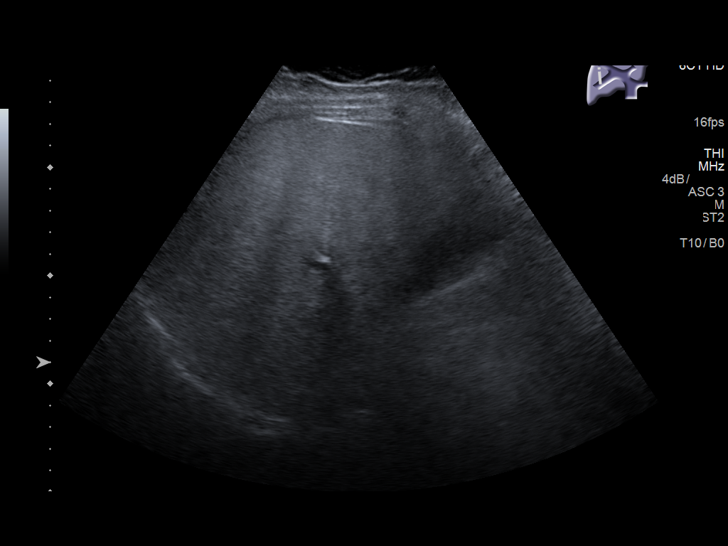
[im 43/64]
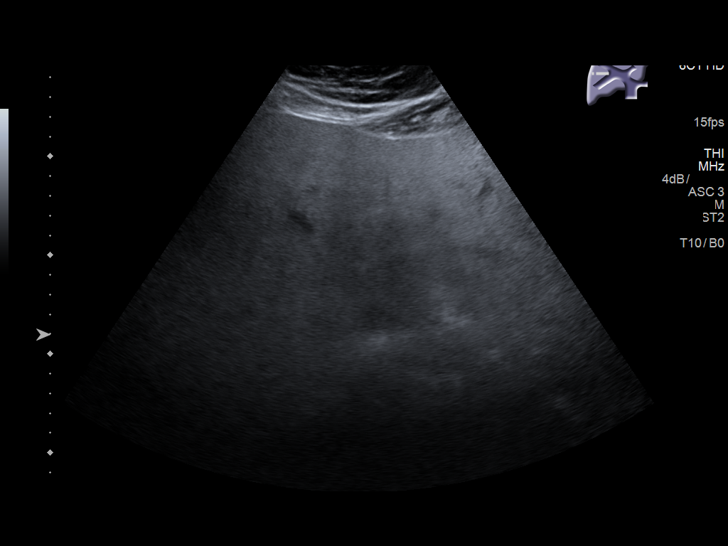
[im 48/64]
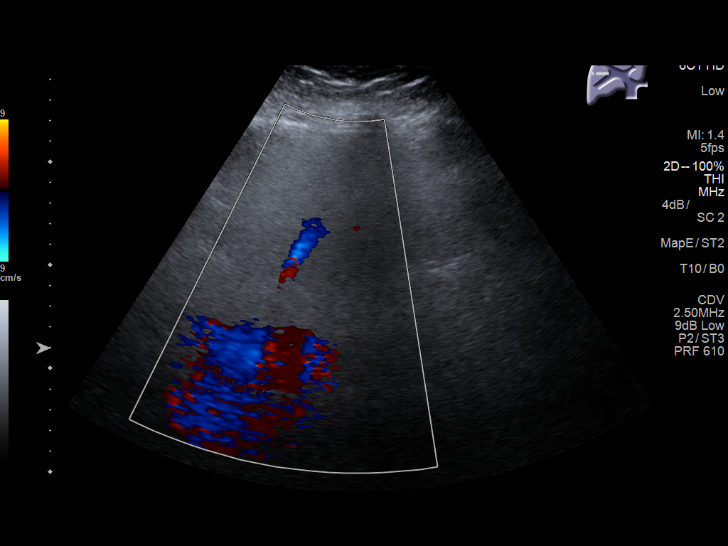
[im 53/64]
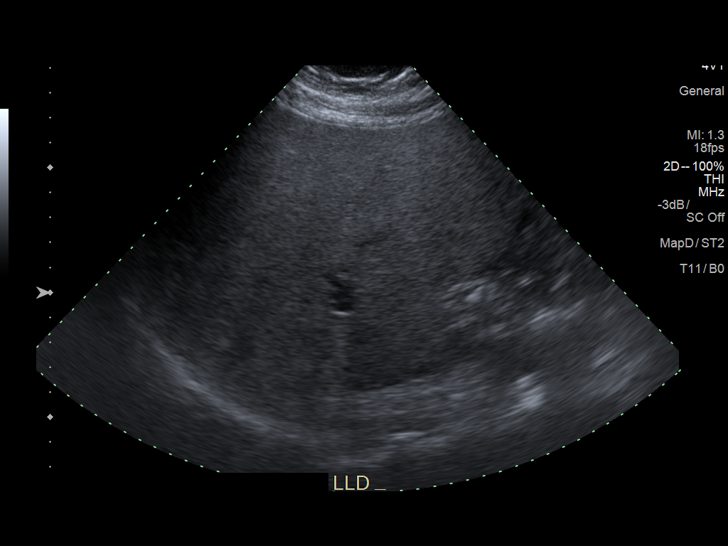
[im 58/64]
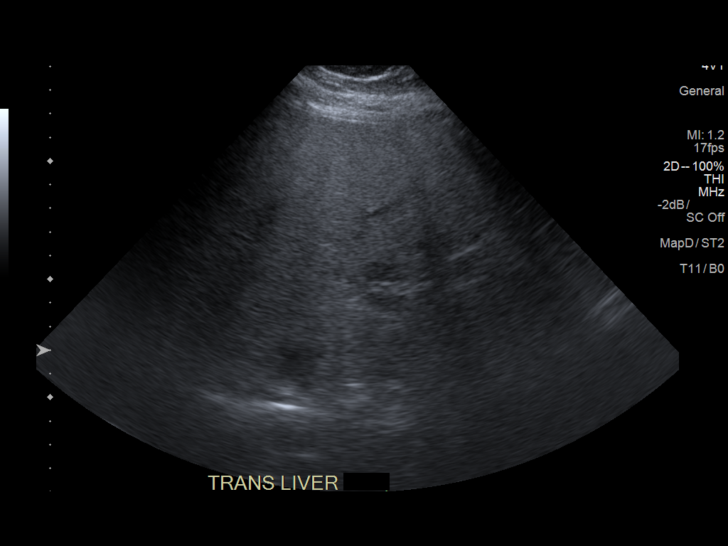
[im 64/64]
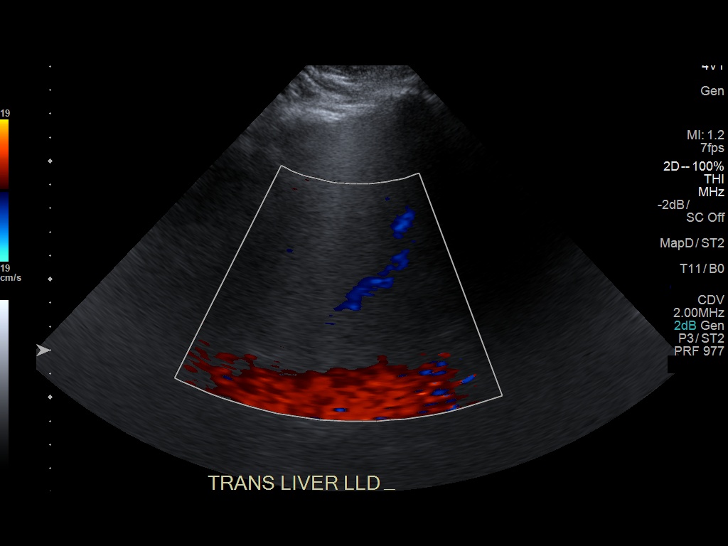

[14 of 25 positions shown; findings below may reference images not displayed]

FINDINGS: Gallbladder:

No gallstones or wall thickening visualized. No sonographic Murphy
sign noted by sonographer.

Common bile duct:

Diameter: 2.3 mm

Liver:

Increased hepatic echogenicity compatible with hepatic steatosis. No
large focal abnormality or biliary dilatation. No surrounding free
fluid or ascites.

Portal vein is patent on color Doppler imaging with normal direction
of blood flow towards the liver.
IMPRESSION: Hepatic steatosis.  No other acute finding by ultrasound
